# Patient Record
Sex: Male | Born: 1970
Health system: Southern US, Community
[De-identification: ages and names within clinical notes are randomized; demographics above are authoritative.]

## PROBLEM LIST (undated history)

## (undated) DIAGNOSIS — I2699 Other pulmonary embolism without acute cor pulmonale: Secondary | ICD-10-CM

## (undated) DIAGNOSIS — I82409 Acute embolism and thrombosis of unspecified deep veins of unspecified lower extremity: Secondary | ICD-10-CM

## (undated) HISTORY — PX: CARDIOVERSION: SHX1299

---

## 2017-01-16 DIAGNOSIS — B356 Tinea cruris: Secondary | ICD-10-CM | POA: Insufficient documentation

## 2017-01-16 DIAGNOSIS — I440 Atrioventricular block, first degree: Secondary | ICD-10-CM | POA: Insufficient documentation

## 2017-01-16 DIAGNOSIS — Z86711 Personal history of pulmonary embolism: Secondary | ICD-10-CM | POA: Insufficient documentation

## 2017-01-16 DIAGNOSIS — Z0000 Encounter for general adult medical examination without abnormal findings: Secondary | ICD-10-CM | POA: Diagnosis not present

## 2017-01-16 DIAGNOSIS — E782 Mixed hyperlipidemia: Secondary | ICD-10-CM | POA: Insufficient documentation

## 2017-10-18 DIAGNOSIS — M79604 Pain in right leg: Secondary | ICD-10-CM | POA: Insufficient documentation

## 2017-10-18 DIAGNOSIS — Z86718 Personal history of other venous thrombosis and embolism: Secondary | ICD-10-CM | POA: Diagnosis not present

## 2017-10-18 DIAGNOSIS — Z7901 Long term (current) use of anticoagulants: Secondary | ICD-10-CM | POA: Diagnosis not present

## 2017-10-18 DIAGNOSIS — M7989 Other specified soft tissue disorders: Secondary | ICD-10-CM | POA: Diagnosis not present

## 2017-10-19 ENCOUNTER — Other Ambulatory Visit: Payer: Self-pay

## 2017-10-19 ENCOUNTER — Emergency Department (HOSPITAL_BASED_OUTPATIENT_CLINIC_OR_DEPARTMENT_OTHER)
Admission: EM | Admit: 2017-10-19 | Discharge: 2017-10-19 | Disposition: A | Discharge status: Home / Self Care | Payer: 59 | Attending: Emergency Medicine | Admitting: Emergency Medicine

## 2017-10-19 ENCOUNTER — Encounter (HOSPITAL_BASED_OUTPATIENT_CLINIC_OR_DEPARTMENT_OTHER): Payer: Self-pay | Admitting: *Deleted

## 2017-10-19 ENCOUNTER — Ambulatory Visit (HOSPITAL_BASED_OUTPATIENT_CLINIC_OR_DEPARTMENT_OTHER)
Admit: 2017-10-19 | Discharge: 2017-10-19 | Disposition: A | Discharge status: Home / Self Care | Payer: 59 | Attending: Emergency Medicine | Admitting: Emergency Medicine

## 2017-10-19 DIAGNOSIS — M79604 Pain in right leg: Secondary | ICD-10-CM

## 2017-10-19 DIAGNOSIS — M7989 Other specified soft tissue disorders: Secondary | ICD-10-CM

## 2017-10-19 DIAGNOSIS — I82401 Acute embolism and thrombosis of unspecified deep veins of right lower extremity: Secondary | ICD-10-CM | POA: Diagnosis not present

## 2017-10-19 HISTORY — DX: Acute embolism and thrombosis of unspecified deep veins of unspecified lower extremity: I82.409

## 2017-10-19 HISTORY — DX: Other pulmonary embolism without acute cor pulmonale: I26.99

## 2017-10-19 LAB — BASIC METABOLIC PANEL
Anion gap: 8 (ref 5–15)
BUN: 17 mg/dL (ref 6–20)
CALCIUM: 8.7 mg/dL — AB (ref 8.9–10.3)
CHLORIDE: 104 mmol/L (ref 101–111)
CO2: 27 mmol/L (ref 22–32)
CREATININE: 1.05 mg/dL (ref 0.61–1.24)
GFR calc Af Amer: >60 mL/min (ref >60)
GFR calc non Af Amer: >60 mL/min (ref >60)
Glucose, Bld: 107 mg/dL — ABNORMAL HIGH (ref 65–99)
Potassium: 3.7 mmol/L (ref 3.5–5.1)
SODIUM: 139 mmol/L (ref 135–145)

## 2017-10-19 LAB — CBC WITH DIFFERENTIAL/PLATELET
Basophils Absolute: 0 10*3/uL (ref 0.0–0.1)
Basophils Relative: 1 %
Eosinophils Absolute: 0.2 10*3/uL (ref 0.0–0.7)
Eosinophils Relative: 4 %
HCT: 35.5 % — ABNORMAL LOW (ref 39.0–52.0)
HEMOGLOBIN: 11.8 g/dL — AB (ref 13.0–17.0)
LYMPHS ABS: 1.7 10*3/uL (ref 0.7–4.0)
Lymphocytes Relative: 35 %
MCH: 26.8 pg (ref 26.0–34.0)
MCHC: 33.2 g/dL (ref 30.0–36.0)
MCV: 80.7 fL (ref 78.0–100.0)
MONO ABS: 0.4 10*3/uL (ref 0.1–1.0)
MONOS PCT: 9 %
NEUTROS PCT: 51 %
Neutro Abs: 2.5 10*3/uL (ref 1.7–7.7)
Platelets: 146 10*3/uL — ABNORMAL LOW (ref 150–400)
RBC: 4.4 MIL/uL (ref 4.22–5.81)
RDW: 13.7 % (ref 11.5–15.5)
WBC: 4.8 10*3/uL (ref 4.0–10.5)

## 2017-10-19 LAB — D-DIMER, QUANTITATIVE: D-Dimer, Quant: 11.16 ug/mL-FEU — ABNORMAL HIGH (ref 0.00–0.50)

## 2017-10-19 MED ORDER — RIVAROXABAN (XARELTO) VTE STARTER PACK (15 & 20 MG): ORAL_TABLET | ORAL | 0 refills | Status: DC

## 2017-10-19 MED ORDER — RIVAROXABAN 15 MG PO TABS
15.0000 mg | ORAL_TABLET | Freq: Once | ORAL | Status: AC
  Administered 2017-10-19: 15 mg via ORAL
  Filled 2017-10-19: qty 1

## 2017-10-19 MED ORDER — ACETAMINOPHEN 500 MG PO TABS
1000.0000 mg | ORAL_TABLET | Freq: Once | ORAL | Status: AC
  Administered 2017-10-19: 1000 mg via ORAL
  Filled 2017-10-19: qty 2

## 2017-10-19 NOTE — ED Provider Notes (Signed)
4:16 PM [facility] today for results on lower extremity DVT ultrasound performed.  Patient's ultrasound was positive for deep venous thrombosis distal to the knee.  Patient was treated with initial dose of Xarelto during visit this morning and had appropriate labs drawn showing normal kidney function.  Patient has a prescription for this.  He was instructed to fill and begin taking immediately.  Patient symptoms are stable today.  He has not developed any chest pain or shortness of breath.  We discussed need to follow-up with his primary care physician.  Wife sees a doctor in this building and he will try to establish care with them.  He may also need to follow-up with a hematologist regarding difficulties with treating previous blood clot.  Counseled on signs and symptoms to return to the emergency department.   Renne CriglerGeiple, Ermagene Saidi, PA-C 10/19/17 1618    Rolland PorterJames, Mark, MD 10/24/17 [phone]

## 2017-10-19 NOTE — ED Triage Notes (Signed)
Right calf pain that started tonight. Hx of DVT. No redness, warmth noted. Denies increased pain with flexion of foot.

## 2017-10-19 NOTE — ED Provider Notes (Signed)
[facility] EMERGENCY DEPARTMENT Provider Note   CSN: [id number] Arrival date & time: 10/18/17  2335     History   Chief Complaint Chief Complaint  Patient presents with  . Leg Pain    HPI [facility] is a 47 y.o. male.  Chief complaint is right leg swelling.  HPI 47 year old male.  History of prior right lower extremity DVT.  This was several years ago.  He states that he underwent extensive testing for "blood disorders".  States that no etiology was found.  He states he was told his only risk factor was "obesity".  He is active.  He exercises.  He has lost 90 pounds.  Few days ago he noticed on the elliptical that his right leg was painful.  He assumed it was because he did not stretch.  However it seems swollen and more uncomfortable to him down he presents here.  No chest pain.  No shortness of breath.  No pleuritic discomfort.  Past Medical History:  Diagnosis Date  . DVT (deep venous thrombosis) (HCC)   . PE (pulmonary thromboembolism) (HCC)     There are no active problems to display for this patient.   Past Surgical History:  Procedure Laterality Date  . CARDIOVERSION         Home Medications    Prior to Admission medications   Medication Sig Start Date End Date Taking? Authorizing Provider  Rivaroxaban 15 & 20 MG TBPK Take as directed on package: Start with one 15mg  tablet by mouth twice a day with food. On Day 22, switch to one 20mg  tablet once a day with food. 10/19/17   Rolland PorterJames, Hollin Crewe, MD    Family History History reviewed. No pertinent family history.  Social History Social History   Tobacco Use  . Smoking status: Never Smoker  . Smokeless tobacco: Never Used  Substance Use Topics  . Alcohol use: No    Frequency: Never  . Drug use: No     Allergies   Patient has no known allergies.   Review of Systems Review of Systems  Constitutional: Negative for appetite change, chills, diaphoresis, fatigue and fever.  HENT: Negative for  mouth sores, sore throat and trouble swallowing.   Eyes: Negative for visual disturbance.  Respiratory: Negative for cough, chest tightness, shortness of breath and wheezing.   Cardiovascular: Negative for chest pain.  Gastrointestinal: Negative for abdominal distention, abdominal pain, diarrhea, nausea and vomiting.  Endocrine: Negative for polydipsia, polyphagia and polyuria.  Genitourinary: Negative for dysuria, frequency and hematuria.  Musculoskeletal: Negative for gait problem.       Right leg pain and swelling.  Skin: Negative for color change, pallor and rash.  Neurological: Negative for dizziness, syncope, light-headedness and headaches.  Hematological: Does not bruise/bleed easily.  Psychiatric/Behavioral: Negative for behavioral problems and confusion.     Physical Exam Updated Vital Signs BP 134/82 (BP Location: Right Arm)   Pulse (!) 59   Temp 98.8 F (37.1 C) (Oral)   Resp 18   Ht 6\' 7"  (2.007 m)   Wt 104.3 kg (230 lb)   SpO2 100%   BMI 25.91 kg/m   Physical Exam  Constitutional: He is oriented to person, place, and time. He appears well-developed and well-nourished. No distress.  HENT:  Head: Normocephalic.  Eyes: Conjunctivae are normal. Pupils are equal, round, and reactive to light. No scleral icterus.  Neck: Normal range of motion. Neck supple. No thyromegaly present.  Cardiovascular: Normal rate and regular rhythm. Exam reveals  no gallop and no friction rub.  No murmur heard. Pulmonary/Chest: Effort normal and breath sounds normal. No respiratory distress. He has no wheezes. He has no rales.  Abdominal: Soft. Bowel sounds are normal. He exhibits no distension. There is no tenderness. There is no rebound.  Musculoskeletal: Normal range of motion.  Right lower extremity measures 2 cm meters larger in circumference versus left.  I can feel areas of tenderness and cording along the area of the greater saphenous vein.  Neurological: He is alert and oriented to  person, place, and time.  Skin: Skin is warm and dry. No rash noted.  Psychiatric: He has a normal mood and affect. His behavior is normal.     ED Treatments / Results  Labs (all labs ordered are listed, but only abnormal results are displayed) Labs Reviewed  D-DIMER, QUANTITATIVE (NOT AT [facility]) - Abnormal; Notable for the following components:      Result Value   D-Dimer, Quant 11.16 (*)    All other components within normal limits  CBC WITH DIFFERENTIAL/PLATELET - Abnormal; Notable for the following components:   Hemoglobin 11.8 (*)    HCT 35.5 (*)    Platelets 146 (*)    All other components within normal limits  BASIC METABOLIC PANEL - Abnormal; Notable for the following components:   Glucose, Bld 107 (*)    Calcium 8.7 (*)    All other components within normal limits    EKG  EKG Interpretation None       Radiology No results found.  Procedures Procedures (including critical care time)  Medications Ordered in ED Medications  acetaminophen (TYLENOL) tablet 1,000 mg (not administered)  Rivaroxaban (XARELTO) tablet 15 mg (15 mg Oral Given 10/19/17 0428)     Initial Impression / Assessment and Plan / ED Course  I have reviewed the triage vital signs and the nursing notes.  Pertinent labs & imaging results that were available during my care of the patient were reviewed by me and considered in my medical decision making (see chart for details).   Dimer is markedly elevated at 11.  He was given Xarelto here.  Scheduled for ultrasound tomorrow.  Given prescription for Xarelto.  High probability this ultrasound will show DVT.  He has at least extensive SVT with his saphenous system.  Final Clinical Impressions(s) / ED Diagnoses   Final diagnoses:  Right leg pain    ED Discharge Orders        Ordered    US Venous Img Lower Unilateral Left  Status:  Canceled     10/19/17 0408    US Venous Img Lower Unilateral Right     10/19/17 0408    Rivaroxaban 15 & 20 MG  TBPK     10/19/17 0436       Rolland Porter, MD 10/19/17 [phone]

## 2017-10-19 NOTE — Discharge Instructions (Signed)
Return tomorrow for your ultrasound. After your ultrasound is completed, you can wait in the waiting room, and the emergency physician tomorrow will give you the results. Do not fill your prescription for your Xarelto until your ultrasound is completed.

## 2017-10-20 ENCOUNTER — Encounter (HOSPITAL_COMMUNITY): Payer: Self-pay | Admitting: Emergency Medicine

## 2017-10-20 ENCOUNTER — Other Ambulatory Visit: Payer: Self-pay

## 2017-10-20 ENCOUNTER — Emergency Department (HOSPITAL_COMMUNITY)
Admission: EM | Admit: 2017-10-20 | Discharge: 2017-10-21 | Disposition: A | Discharge status: Home / Self Care | Payer: 59 | Attending: Emergency Medicine | Admitting: Emergency Medicine

## 2017-10-20 DIAGNOSIS — R319 Hematuria, unspecified: Secondary | ICD-10-CM

## 2017-10-20 DIAGNOSIS — N200 Calculus of kidney: Secondary | ICD-10-CM | POA: Insufficient documentation

## 2017-10-20 DIAGNOSIS — N132 Hydronephrosis with renal and ureteral calculous obstruction: Secondary | ICD-10-CM | POA: Diagnosis not present

## 2017-10-20 DIAGNOSIS — Z7901 Long term (current) use of anticoagulants: Secondary | ICD-10-CM | POA: Diagnosis not present

## 2017-10-20 DIAGNOSIS — Z79899 Other long term (current) drug therapy: Secondary | ICD-10-CM | POA: Insufficient documentation

## 2017-10-20 LAB — BASIC METABOLIC PANEL
Anion gap: 10 (ref 5–15)
BUN: 12 mg/dL (ref 6–20)
CALCIUM: 8.6 mg/dL — AB (ref 8.9–10.3)
CO2: 25 mmol/L (ref 22–32)
CREATININE: 0.85 mg/dL (ref 0.61–1.24)
Chloride: 100 mmol/L — ABNORMAL LOW (ref 101–111)
GFR calc Af Amer: >60 mL/min (ref >60)
GLUCOSE: 103 mg/dL — AB (ref 65–99)
Potassium: 3.9 mmol/L (ref 3.5–5.1)
SODIUM: 135 mmol/L (ref 135–145)

## 2017-10-20 LAB — URINALYSIS, ROUTINE W REFLEX MICROSCOPIC
Bilirubin Urine: NEGATIVE
GLUCOSE, UA: NEGATIVE mg/dL
KETONES UR: NEGATIVE mg/dL
NITRITE: NEGATIVE
PROTEIN: 100 mg/dL — AB
Specific Gravity, Urine: 1.015 (ref 1.005–1.030)
Squamous Epithelial / LPF: NONE SEEN
pH: 7 (ref 5.0–8.0)

## 2017-10-20 LAB — CBC
HCT: 37.8 % — ABNORMAL LOW (ref 39.0–52.0)
Hemoglobin: 12.3 g/dL — ABNORMAL LOW (ref 13.0–17.0)
MCH: 26.5 pg (ref 26.0–34.0)
MCHC: 32.5 g/dL (ref 30.0–36.0)
MCV: 81.3 fL (ref 78.0–100.0)
PLATELETS: 169 10*3/uL (ref 150–400)
RBC: 4.65 MIL/uL (ref 4.22–5.81)
RDW: 13.9 % (ref 11.5–15.5)
WBC: 4.8 10*3/uL (ref 4.0–10.5)

## 2017-10-20 LAB — PROTIME-INR
INR: 1.18
PROTHROMBIN TIME: 14.9 s (ref 11.4–15.2)

## 2017-10-20 NOTE — ED Triage Notes (Signed)
Reports being diagnosed with DVT on RLE on Saturday.  Was placed on xarelto yesterday.  Noticed blood in urine last night.  Hx of DVT and PE in past.  Reports having to be on high doses of anticoags.  Also c/o low back pain.

## 2017-10-20 NOTE — ED Provider Notes (Signed)
[facility] Provider Note   CSN: [id number] Arrival date & time: 10/20/17  1929     History   Chief Complaint Chief Complaint  Patient presents with  . Hematuria    HPI [facility] is a 47 y.o. male.  47 year old male with a history of PE, diagnosed with DVT yesterday and started on Xarelto, presents to the emergency department for gross hematuria.  He states that hematuria began 4 hours after his first dose of this anticoagulant.  He has had waxing and waning, constant hematuria since onset of the symptoms.  He also notes a mild ache in his low back which is more midline.  Pain is constant and not positional.  It does not wax and wane in severity.  He has not taken any medications for his back discomfort.  He has no history of kidney stones.  No associated fever, nausea, vomiting, dysuria, bowel changes.  No recent trauma, injury, fall.  He was previously on warfarin many years ago after diagnosis of pulmonary embolus.  He was taken off of this medication following electrical cardioversion as his warfarin was thought to have incited onset of atrial fibrillation.      Past Medical History:  Diagnosis Date  . DVT (deep venous thrombosis) (HCC)   . PE (pulmonary thromboembolism) (HCC)     There are no active problems to display for this patient.   Past Surgical History:  Procedure Laterality Date  . CARDIOVERSION         Home Medications    Prior to Admission medications   Medication Sig Start Date End Date Taking? Authorizing Provider  acetaminophen (TYLENOL) 500 MG tablet Take 1,000 mg by mouth every 6 (six) hours as needed for mild pain.   Yes [provider]  Rivaroxaban 15 & 20 MG TBPK Take as directed on package: Start with one 15mg  tablet by mouth twice a day with food. On Day 22, switch to one 20mg  tablet once a day with food. 10/19/17  Yes Rolland Porter, MD    Family History No family history on  file.  Social History Social History   Tobacco Use  . Smoking status: Never Smoker  . Smokeless tobacco: Never Used  Substance Use Topics  . Alcohol use: No    Frequency: Never  . Drug use: No     Allergies   Patient has no known allergies.   Review of Systems Review of Systems Ten systems reviewed and are negative for acute change, except as noted in the HPI.    Physical Exam Updated Vital Signs BP 122/71   Pulse (!) 51   Temp 97.8 F (36.6 C) (Oral)   Resp 15   Ht 6\' 7"  (2.007 m)   Wt 104.3 kg (230 lb)   SpO2 98%   BMI 25.91 kg/m   Physical Exam  Constitutional: He is oriented to person, place, and time. He appears well-developed and well-nourished. No distress.  Nontoxic appearing and pleasant  HENT:  Head: Normocephalic and atraumatic.  Eyes: Conjunctivae and EOM are normal. No scleral icterus.  Neck: Normal range of motion.  Cardiovascular: Normal rate, regular rhythm and intact distal pulses.  Pulmonary/Chest: Effort normal. No stridor. No respiratory distress. He has no wheezes.  Respirations even and unlabored.  Abdominal: Soft. He exhibits no distension and no mass. There is no tenderness. There is no guarding.  Soft, nondistended, nontender. No guarding or rigidity.  Musculoskeletal: Normal range of motion.  Neurological: He is  alert and oriented to person, place, and time. He exhibits normal muscle tone. Coordination normal.  GCS 15. Patient moving all extremities.  Skin: Skin is warm and dry. No rash noted. He is not diaphoretic. No erythema. No pallor.  No bruising to back or abdomen.  Psychiatric: He has a normal mood and affect. His behavior is normal.  Nursing note and vitals reviewed.    ED Treatments / Results  Labs (all labs ordered are listed, but only abnormal results are displayed) Labs Reviewed  URINALYSIS, ROUTINE W REFLEX MICROSCOPIC - Abnormal; Notable for the following components:      Result Value   APPearance CLOUDY (*)     Hgb urine dipstick LARGE (*)    Protein, ur 100 (*)    Leukocytes, UA TRACE (*)    Bacteria, UA RARE (*)    All other components within normal limits  BASIC METABOLIC PANEL - Abnormal; Notable for the following components:   Chloride 100 (*)    Glucose, Bld 103 (*)    Calcium 8.6 (*)    All other components within normal limits  CBC - Abnormal; Notable for the following components:   Hemoglobin 12.3 (*)    HCT 37.8 (*)    All other components within normal limits  PROTIME-INR    EKG  EKG Interpretation None       Radiology [place] Venous Img Lower Unilateral Right  Result Date: 10/19/2017 CLINICAL DATA:  Right calf pain and swelling for 3 days. EXAM: RIGHT LOWER EXTREMITY VENOUS DOPPLER ULTRASOUND TECHNIQUE: Gray-scale sonography with graded compression, as well as color Doppler and duplex ultrasound were performed to evaluate the lower extremity deep venous systems from the level of the common femoral vein and including the common femoral, femoral, profunda femoral, popliteal and calf veins including the posterior tibial, peroneal and gastrocnemius veins when visible. The superficial great saphenous vein was also interrogated. Spectral Doppler was utilized to evaluate flow at rest and with distal augmentation maneuvers in the common femoral, femoral and popliteal veins. COMPARISON:  None. FINDINGS: Contralateral Common Femoral Vein: Respiratory phasicity is normal and symmetric with the symptomatic side. No evidence of thrombus. Normal compressibility. Common Femoral Vein: No evidence of thrombus. Normal compressibility, respiratory phasicity and response to augmentation. Saphenofemoral Junction: No evidence of thrombus. Normal compressibility and flow on color Doppler imaging. Profunda Femoral Vein: No evidence of thrombus. Normal compressibility and flow on color Doppler imaging. Femoral Vein: No evidence of thrombus. Normal compressibility, respiratory phasicity and response to  augmentation. Popliteal Vein: Partial compressibility of the right popliteal vein with echogenic thrombus. Popliteal vein thrombus is nonocclusive and associated with the gastrocnemius thrombus. Calf Veins: Gastrocnemius vein(s) is markedly expanded and contains a large amount of thrombus. This gastrocnemius thrombus is extending into the popliteal vein. In addition, there is thrombus involving the mid and distal posterior tibial veins. Posterior tibial veins at the ankle are patent. Peroneal veins are compressible and patent. Superficial Great Saphenous Vein: There is thrombus in the great saphenous vein within the superior calf and at the knee. The upper thigh great saphenous vein is compressible and patent. Other Findings:  None. IMPRESSION: Positive for deep venous thrombosis in the right lower extremity. Largest clot burden involves the right gastrocnemius veins. There is also thrombus in the right posterior tibial veins and there is thrombus extending into the right popliteal vein. Positive for superficial venous thrombosis in the right great saphenous vein. Electronically Signed   By: Richarda OverlieAdam  Henn M.D.   On:  10/19/2017 15:38   Ct Renal Stone Study  Result Date: 10/21/2017 CLINICAL DATA:  Hematuria, unknown cause. Recent diagnosis of DVT started anticoagulation. EXAM: CT ABDOMEN AND PELVIS WITHOUT CONTRAST TECHNIQUE: Multidetector CT imaging of the abdomen and pelvis was performed following the standard protocol without IV contrast. COMPARISON:  None. FINDINGS: Lower chest: Mild hypoventilatory changes at the lung bases. Hepatobiliary: No focal hepatic lesion allowing for lack contrast. Gallbladder is decompressed. No calcified gallstone. Pancreas: No ductal dilatation or inflammation. Spleen: Normal in size without focal abnormality. Adrenals/Urinary Tract: No adrenal nodule. Horseshoe kidney configuration. Mild hydronephrosis of left renal moiety with 2 adjacent stones conglomerate measuring 13 mm in the  renal pelvis. Mild prominence of the right renal pelvis without calyceal dilatation. Punctate stone in the lower right renal moiety. Both ureters are decompressed without ureteral stone. Urinary bladder is partially distended without bladder wall thickening or stone. Stomach/Bowel: Stomach distended with ingested contents. No small bowel dilatation, inflammation or obstruction. Moderate colonic stool burden. Mild distal colonic diverticulosis without diverticulitis. Appendix not confidently visualized. Vascular/Lymphatic: Mild tortuosity of infrarenal aorta and IVC. No enlarged abdominal or pelvic lymph nodes. Reproductive: Prostate is unremarkable. Prominent right seminal vesicle. No definite left seminal vesicle visualized. Other: No free air, free fluid, or intra-abdominal fluid collection. Musculoskeletal: There are no acute or suspicious osseous abnormalities. IMPRESSION: 1. Horseshoe kidney with mild hydronephrosis and perinephric edema about the left renal moiety. Two adjacent stones in the left renal pelvis measures 13 mm are likely intermittently obstructing. No ureteral stone. 2. Nonobstructing stone in the lower right renal moiety. 3. Mild colonic diverticulosis without diverticulitis. Electronically Signed   By: Rubye Oaks M.D.   On: 10/21/2017 00:50    Procedures Procedures (including critical care time)  Medications Ordered in ED Medications - No data to display    2:00 AM On repeat assessment, patient states that his back pain has improved and his hematuria has resolved.  He had a void in the emergency department which was free of blood.  Last dose of Xarelto was 16 hours ago.   Initial Impression / Assessment and Plan / ED Course  I have reviewed the triage vital signs and the nursing notes.  Pertinent labs & imaging results that were available during my care of the patient were reviewed by me and considered in my medical decision making (see chart for details).      47 year old male presents to the emergency department for hematuria.  He was started on Xarelto yesterday after an ultrasound revealed DVT in the right lower extremity.  Hematuria waxing and waning in severity.  It is associated with lower back discomfort which has been a mild ache.  No associated nausea, vomiting, bowel changes, fever.  Patient noted to be hemodynamically stable since ED arrival.  He has had no tachycardia or hypotension.  Hemoglobin similar to blood draw yesterday.  Urinalysis did reveal gross hematuria without bacteriuria or pyuria.  A CT was recommended and performed.  CT scan today reveals mild hydronephrosis and perinephric edema on the left.  This is associated with a 13 mm stone in the left renal pelvis.  Findings favored to represent intermittent obstructing kidney stone.  No other acute process noted in the abdomen or pelvis.  No evidence of free fluid or other intra-abdominal fluid collection.  Patient has no bruising to his back or anterior abdomen.  I do not believe his pain is secondary to retroperitoneal hemorrhage or other spontaneous intra-abdominal bleed.  On repeat assessment, patient  reports resolution of his hematuria and back pain.  I do not see any indication to change his current anticoagulation regimen.  The patient has been given strict bleeding precautions.  I have advised continued follow-up with a primary care doctor for recheck of symptoms and repeat hemoglobin.  Patient told to avoid NSAIDs.  He may take Tylenol as needed for pain; patient verbalizes understanding.  Return precautions provided. Patient discharged in stable condition with no unaddressed concerns.  Vitals:   10/20/17 2300 10/20/17 2315 10/20/17 2330 10/20/17 2345  BP: 124/89 (!) 141/86 (!) 141/93 122/71  Pulse: (!) 50 (!) 49 (!) 55 (!) 51  Resp:      Temp:      TempSrc:      SpO2: 99% 100% 98% 98%  Weight:      Height:        Final Clinical Impressions(s) / ED Diagnoses   Final  diagnoses:  Calculus of renal pelvis  Hematuria, unspecified type    ED Discharge Orders    None       Antony Madura, PA-C 10/21/17 0326    Shon Baton, MD 10/21/17 [phone]

## 2017-10-21 ENCOUNTER — Emergency Department (HOSPITAL_COMMUNITY): Payer: 59

## 2017-10-21 DIAGNOSIS — N132 Hydronephrosis with renal and ureteral calculous obstruction: Secondary | ICD-10-CM | POA: Diagnosis not present

## 2017-10-21 NOTE — ED Notes (Signed)
Pt to CT at this time.  Wife at bedside.

## 2017-10-21 NOTE — Discharge Instructions (Signed)
Your CT scan today showed a stone in your left renal pelvis.  There is evidence that this stone may be intermittently obstructing your left ureter.  This can account for your hematuria as well as you are back pain.  Your kidney function is stable.  Your hemoglobin is reassuring and also stable compared to your workup yesterday.  There is no evidence of a concerning degree of blood loss despite you taking daily Xarelto.  We advise follow-up with a primary care doctor for a recheck of your symptoms.  Continue taking Xarelto as previously prescribed.  Should you develop lightheadedness, especially with position change, loss of consciousness, chest pain or shortness of breath, palpitations (especially in the setting of persistent bleeding), we advise prompt return to the emergency department for evaluation.  You may also return for other new or concerning symptoms.

## 2017-10-25 DIAGNOSIS — D500 Iron deficiency anemia secondary to blood loss (chronic): Secondary | ICD-10-CM | POA: Diagnosis not present

## 2017-10-25 DIAGNOSIS — I82401 Acute embolism and thrombosis of unspecified deep veins of right lower extremity: Secondary | ICD-10-CM | POA: Diagnosis not present

## 2017-10-25 DIAGNOSIS — N200 Calculus of kidney: Secondary | ICD-10-CM | POA: Insufficient documentation

## 2017-10-25 DIAGNOSIS — R310 Gross hematuria: Secondary | ICD-10-CM | POA: Insufficient documentation

## 2017-10-25 DIAGNOSIS — Q631 Lobulated, fused and horseshoe kidney: Secondary | ICD-10-CM | POA: Diagnosis not present

## 2017-10-28 DIAGNOSIS — N200 Calculus of kidney: Secondary | ICD-10-CM | POA: Diagnosis not present

## 2017-11-02 DIAGNOSIS — Z9229 Personal history of other drug therapy: Secondary | ICD-10-CM | POA: Diagnosis not present

## 2017-11-05 DIAGNOSIS — Q631 Lobulated, fused and horseshoe kidney: Secondary | ICD-10-CM | POA: Diagnosis not present

## 2017-11-05 DIAGNOSIS — N200 Calculus of kidney: Secondary | ICD-10-CM | POA: Diagnosis not present

## 2017-11-05 DIAGNOSIS — R310 Gross hematuria: Secondary | ICD-10-CM | POA: Diagnosis not present

## 2017-11-06 DIAGNOSIS — Z86711 Personal history of pulmonary embolism: Secondary | ICD-10-CM | POA: Diagnosis not present

## 2017-11-06 DIAGNOSIS — Q631 Lobulated, fused and horseshoe kidney: Secondary | ICD-10-CM | POA: Diagnosis not present

## 2017-11-06 DIAGNOSIS — I82401 Acute embolism and thrombosis of unspecified deep veins of right lower extremity: Secondary | ICD-10-CM | POA: Diagnosis not present

## 2017-11-06 DIAGNOSIS — Z7189 Other specified counseling: Secondary | ICD-10-CM | POA: Insufficient documentation

## 2017-12-06 DIAGNOSIS — I824Z9 Acute embolism and thrombosis of unspecified deep veins of unspecified distal lower extremity: Secondary | ICD-10-CM | POA: Diagnosis not present

## 2017-12-06 DIAGNOSIS — Z7901 Long term (current) use of anticoagulants: Secondary | ICD-10-CM | POA: Diagnosis not present

## 2017-12-06 DIAGNOSIS — Q631 Lobulated, fused and horseshoe kidney: Secondary | ICD-10-CM | POA: Diagnosis not present

## 2017-12-06 DIAGNOSIS — R310 Gross hematuria: Secondary | ICD-10-CM | POA: Diagnosis not present

## 2017-12-09 DIAGNOSIS — I82409 Acute embolism and thrombosis of unspecified deep veins of unspecified lower extremity: Secondary | ICD-10-CM | POA: Diagnosis not present

## 2017-12-24 DIAGNOSIS — N200 Calculus of kidney: Secondary | ICD-10-CM | POA: Diagnosis not present

## 2017-12-24 DIAGNOSIS — Z86711 Personal history of pulmonary embolism: Secondary | ICD-10-CM | POA: Diagnosis not present

## 2017-12-24 DIAGNOSIS — I82401 Acute embolism and thrombosis of unspecified deep veins of right lower extremity: Secondary | ICD-10-CM | POA: Diagnosis not present

## 2018-01-09 DIAGNOSIS — N200 Calculus of kidney: Secondary | ICD-10-CM | POA: Diagnosis not present

## 2018-01-09 DIAGNOSIS — Q631 Lobulated, fused and horseshoe kidney: Secondary | ICD-10-CM | POA: Diagnosis not present

## 2018-01-10 DIAGNOSIS — N200 Calculus of kidney: Secondary | ICD-10-CM | POA: Diagnosis not present

## 2018-01-10 DIAGNOSIS — Q631 Lobulated, fused and horseshoe kidney: Secondary | ICD-10-CM | POA: Diagnosis not present

## 2018-01-10 DIAGNOSIS — R310 Gross hematuria: Secondary | ICD-10-CM | POA: Diagnosis not present

## 2018-01-14 DIAGNOSIS — E782 Mixed hyperlipidemia: Secondary | ICD-10-CM | POA: Diagnosis not present

## 2018-01-14 DIAGNOSIS — I480 Paroxysmal atrial fibrillation: Secondary | ICD-10-CM | POA: Insufficient documentation

## 2018-01-14 DIAGNOSIS — D509 Iron deficiency anemia, unspecified: Secondary | ICD-10-CM | POA: Insufficient documentation

## 2018-01-14 DIAGNOSIS — N200 Calculus of kidney: Secondary | ICD-10-CM | POA: Diagnosis not present

## 2018-01-14 DIAGNOSIS — Q631 Lobulated, fused and horseshoe kidney: Secondary | ICD-10-CM | POA: Diagnosis not present

## 2018-01-17 DIAGNOSIS — Z0000 Encounter for general adult medical examination without abnormal findings: Secondary | ICD-10-CM | POA: Diagnosis not present

## 2018-02-05 DIAGNOSIS — N200 Calculus of kidney: Secondary | ICD-10-CM | POA: Diagnosis not present

## 2018-02-19 DIAGNOSIS — N200 Calculus of kidney: Secondary | ICD-10-CM | POA: Diagnosis not present

## 2018-03-12 DIAGNOSIS — N200 Calculus of kidney: Secondary | ICD-10-CM | POA: Diagnosis not present

## 2018-03-26 DIAGNOSIS — N200 Calculus of kidney: Secondary | ICD-10-CM | POA: Diagnosis not present

## 2018-03-27 DIAGNOSIS — N200 Calculus of kidney: Secondary | ICD-10-CM | POA: Diagnosis not present

## 2018-06-02 DIAGNOSIS — N200 Calculus of kidney: Secondary | ICD-10-CM | POA: Diagnosis not present

## 2018-06-02 DIAGNOSIS — Q631 Lobulated, fused and horseshoe kidney: Secondary | ICD-10-CM | POA: Diagnosis not present

## 2018-06-11 DIAGNOSIS — N200 Calculus of kidney: Secondary | ICD-10-CM | POA: Diagnosis not present

## 2018-06-16 IMAGING — CT CT RENAL STONE PROTOCOL
2 of 4 series · 16 of 46 positions shown, 18 images · non-contrast
Comparison: None.

CLINICAL DATA: Hematuria, unknown cause. Recent diagnosis of DVT
started anticoagulation.

EXAM:
CT ABDOMEN AND PELVIS WITHOUT CONTRAST
TECHNIQUE: Multidetector CT imaging of the abdomen and pelvis was performed
following the standard protocol without IV contrast.

[Series 3: renal stone 5.0 · axial · 0.85mm/px · z∈[-590,-110]mm · 13 of 106 slices shown, 15 images]
[im 5/106  soft-tissue]
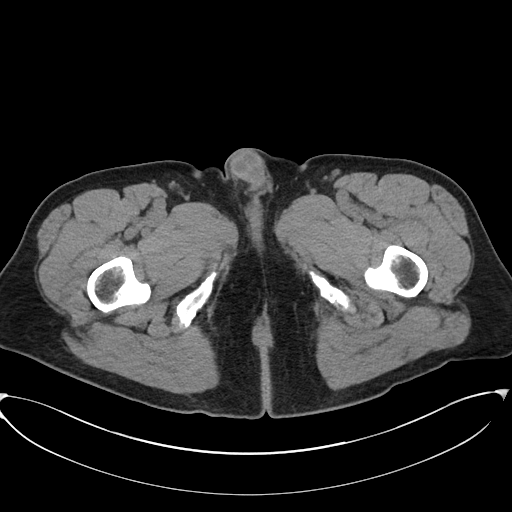
[im 5/106  bone]
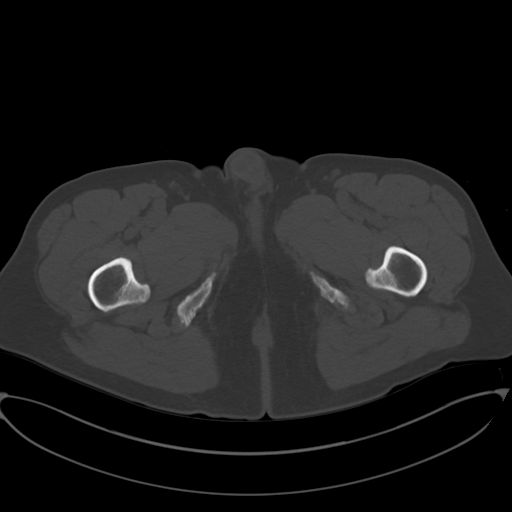
[im 13/106  soft-tissue]
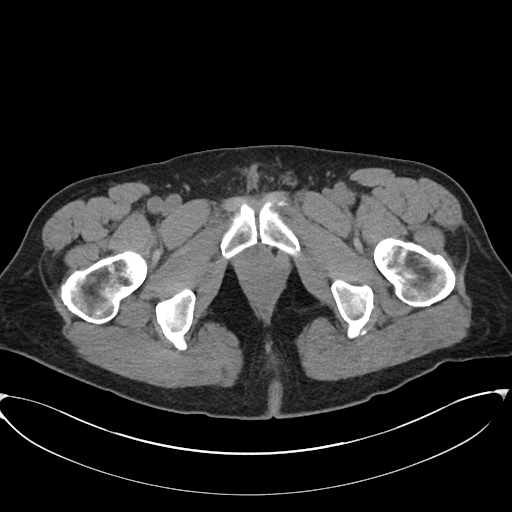
[im 21/106  soft-tissue]
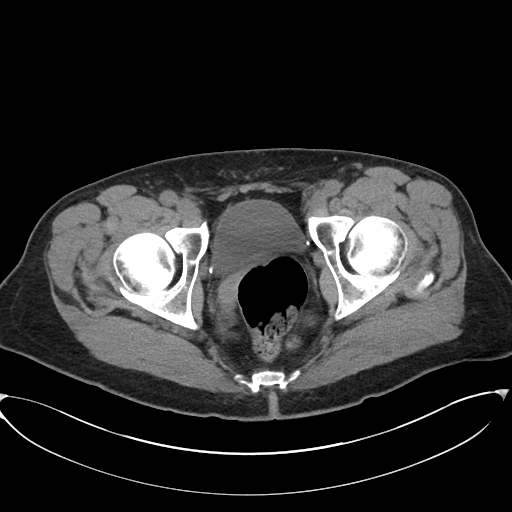
[im 29/106  soft-tissue]
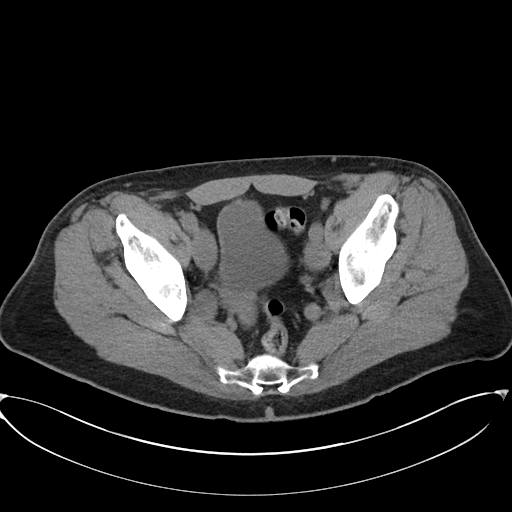
[im 37/106  soft-tissue]
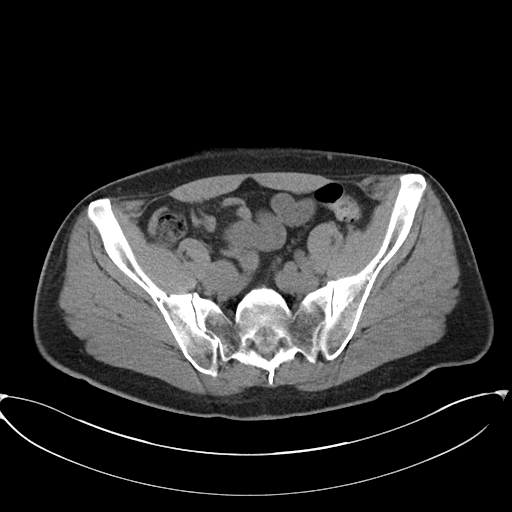
[im 45/106  soft-tissue]
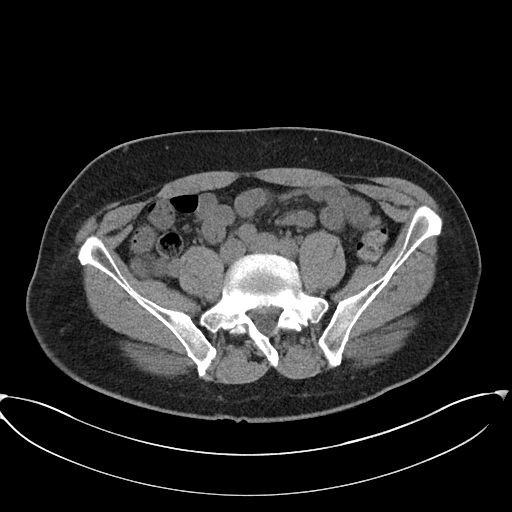
[im 53/106  soft-tissue]
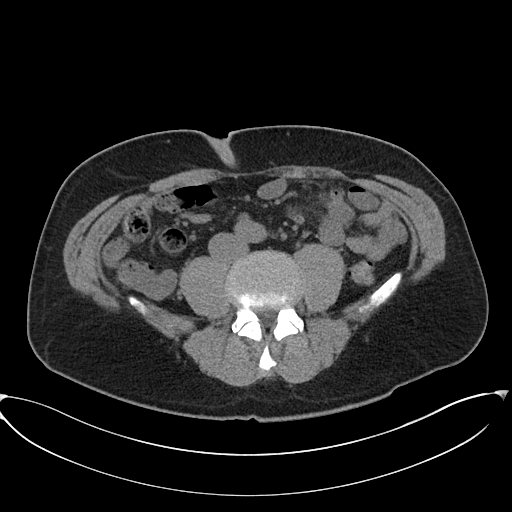
[im 61/106  soft-tissue]
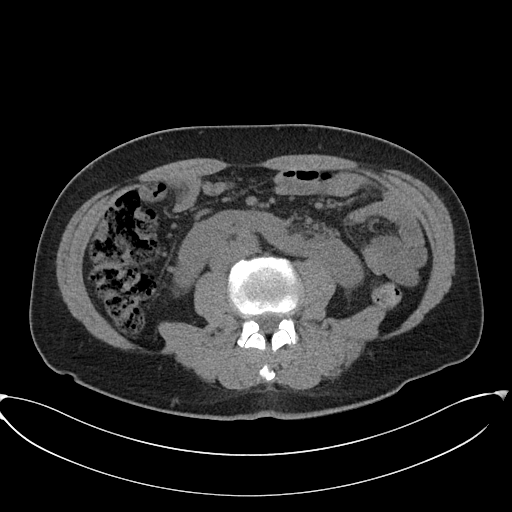
[im 69/106  soft-tissue]
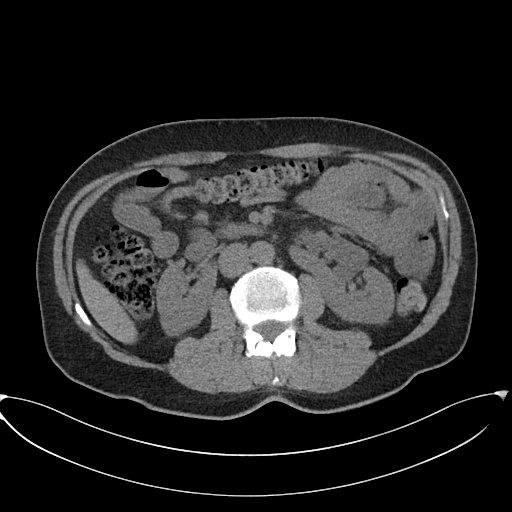
[im 69/106  bone]
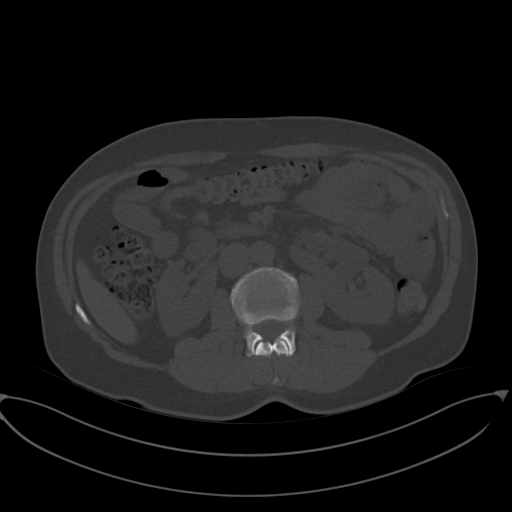
[im 77/106  soft-tissue]
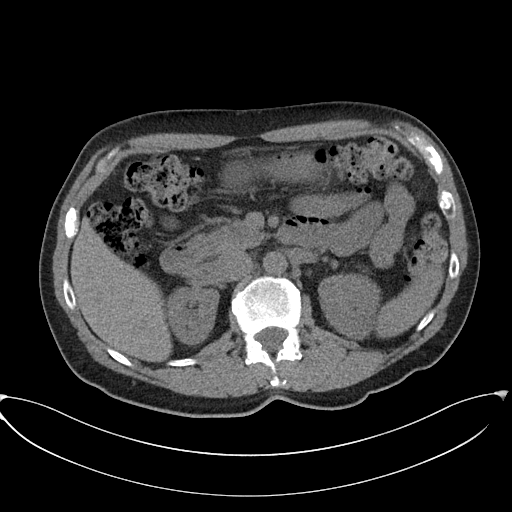
[im 85/106  soft-tissue]
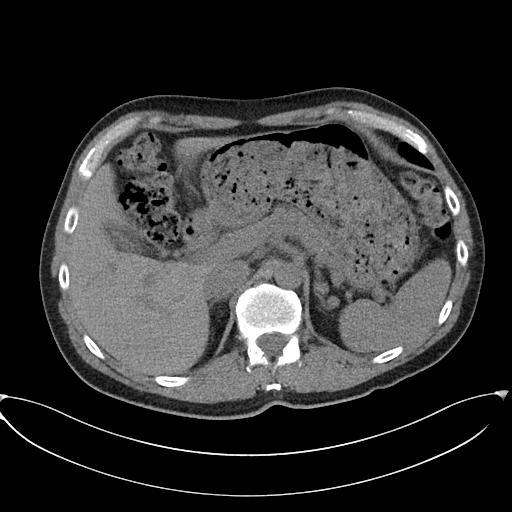
[im 93/106  soft-tissue]
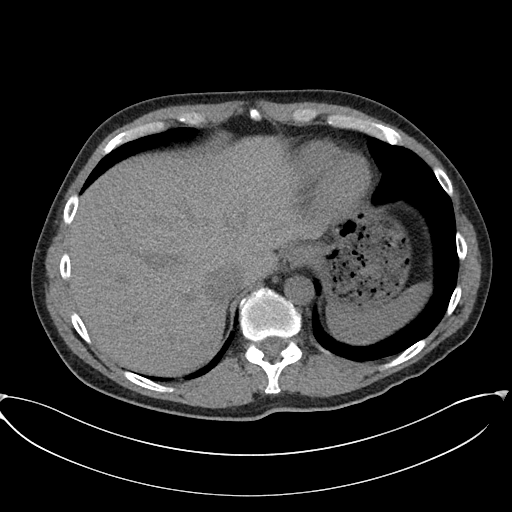
[im 101/106  soft-tissue]
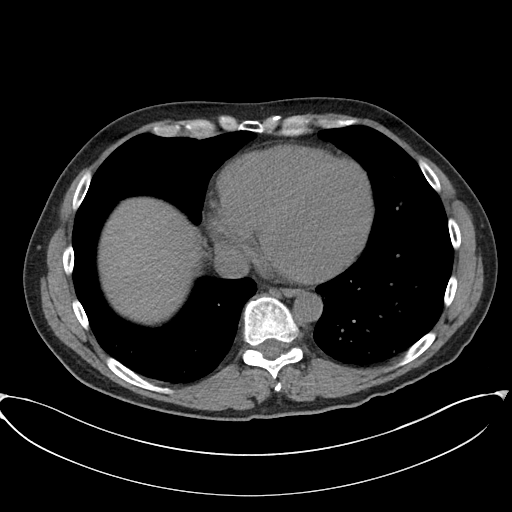

[Series 5: renal stone 3.0 cor · coronal · 1.01mm/px · 3 of 88 slices shown]
[im 30/88  soft-tissue]
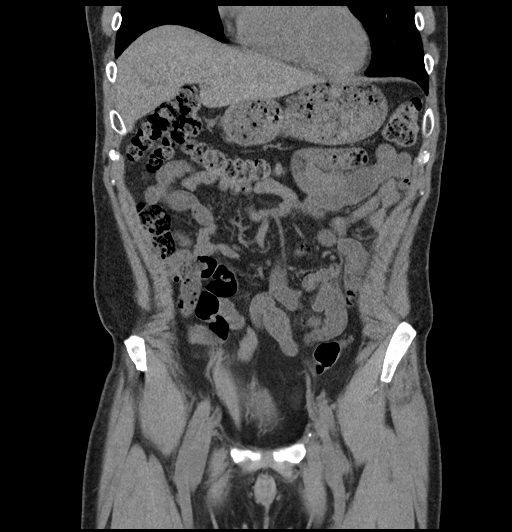
[im 39/88  soft-tissue]
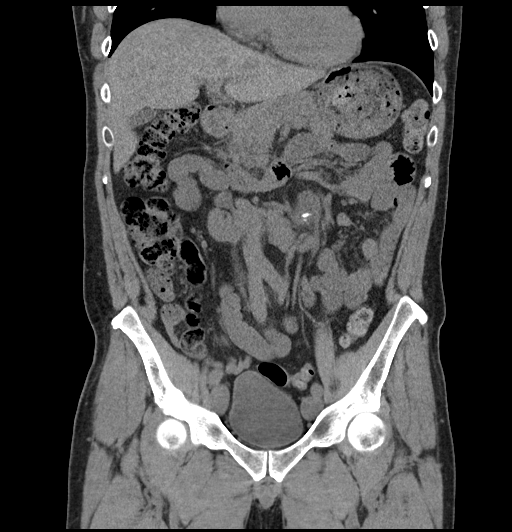
[im 49/88  soft-tissue]
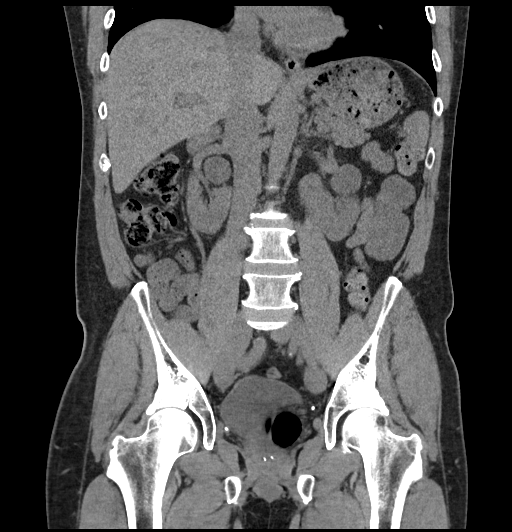

[16 of 46 positions shown; findings below may reference images not displayed]

FINDINGS: Lower chest: Mild hypoventilatory changes at the lung bases.

Hepatobiliary: No focal hepatic lesion allowing for lack contrast.
Gallbladder is decompressed. No calcified gallstone.

Pancreas: No ductal dilatation or inflammation.

Spleen: Normal in size without focal abnormality.

Adrenals/Urinary Tract: No adrenal nodule. Horseshoe kidney
configuration. Mild hydronephrosis of left renal moiety with 2
adjacent stones conglomerate measuring 13 mm in the renal pelvis.
Mild prominence of the right renal pelvis without calyceal
dilatation. Punctate stone in the lower right renal moiety. Both
ureters are decompressed without ureteral stone. Urinary bladder is
partially distended without bladder wall thickening or stone.

Stomach/Bowel: Stomach distended with ingested contents. No small
bowel dilatation, inflammation or obstruction. Moderate colonic
stool burden. Mild distal colonic diverticulosis without
diverticulitis. Appendix not confidently visualized.

Vascular/Lymphatic: Mild tortuosity of infrarenal aorta and IVC. No
enlarged abdominal or pelvic lymph nodes.

Reproductive: Prostate is unremarkable. Prominent right seminal
vesicle. No definite left seminal vesicle visualized.

Other: No free air, free fluid, or intra-abdominal fluid collection.

Musculoskeletal: There are no acute or suspicious osseous
abnormalities.
IMPRESSION: 1. Horseshoe kidney with mild hydronephrosis and perinephric edema
about the left renal moiety. Two adjacent stones in the left renal
pelvis measures 13 mm are likely intermittently obstructing. No
ureteral stone.
2. Nonobstructing stone in the lower right renal moiety.
3. Mild colonic diverticulosis without diverticulitis.

## 2018-07-29 DIAGNOSIS — N200 Calculus of kidney: Secondary | ICD-10-CM | POA: Diagnosis not present

## 2018-07-29 DIAGNOSIS — Z23 Encounter for immunization: Secondary | ICD-10-CM | POA: Diagnosis not present

## 2018-07-29 DIAGNOSIS — Z0000 Encounter for general adult medical examination without abnormal findings: Secondary | ICD-10-CM | POA: Diagnosis not present

## 2018-07-29 DIAGNOSIS — Z0184 Encounter for antibody response examination: Secondary | ICD-10-CM | POA: Diagnosis not present

## 2018-07-29 DIAGNOSIS — Z13220 Encounter for screening for lipoid disorders: Secondary | ICD-10-CM | POA: Diagnosis not present

## 2018-08-06 DIAGNOSIS — Q631 Lobulated, fused and horseshoe kidney: Secondary | ICD-10-CM | POA: Diagnosis not present

## 2018-08-06 DIAGNOSIS — N200 Calculus of kidney: Secondary | ICD-10-CM | POA: Diagnosis not present

## 2018-08-18 DIAGNOSIS — N200 Calculus of kidney: Secondary | ICD-10-CM | POA: Diagnosis not present

## 2019-06-13 DIAGNOSIS — S92425A Nondisplaced fracture of distal phalanx of left great toe, initial encounter for closed fracture: Secondary | ICD-10-CM | POA: Diagnosis not present

## 2019-06-23 DIAGNOSIS — S92422A Displaced fracture of distal phalanx of left great toe, initial encounter for closed fracture: Secondary | ICD-10-CM | POA: Diagnosis not present

## 2019-06-23 DIAGNOSIS — M25572 Pain in left ankle and joints of left foot: Secondary | ICD-10-CM | POA: Diagnosis not present

## 2019-06-23 DIAGNOSIS — R52 Pain, unspecified: Secondary | ICD-10-CM | POA: Diagnosis not present

## 2020-03-22 DIAGNOSIS — Z0000 Encounter for general adult medical examination without abnormal findings: Secondary | ICD-10-CM | POA: Diagnosis not present

## 2020-03-22 DIAGNOSIS — E7800 Pure hypercholesterolemia, unspecified: Secondary | ICD-10-CM | POA: Diagnosis not present

## 2020-04-01 DIAGNOSIS — Z1211 Encounter for screening for malignant neoplasm of colon: Secondary | ICD-10-CM | POA: Diagnosis not present

## 2020-04-19 ENCOUNTER — Ambulatory Visit: Payer: BC Managed Care – PPO | Admitting: Physician Assistant

## 2020-04-19 ENCOUNTER — Other Ambulatory Visit: Payer: Self-pay | Admitting: *Deleted

## 2020-04-19 ENCOUNTER — Other Ambulatory Visit: Payer: Self-pay

## 2020-04-19 ENCOUNTER — Ambulatory Visit (HOSPITAL_COMMUNITY)
Admission: RE | Admit: 2020-04-19 | Discharge: 2020-04-19 | Disposition: A | Discharge status: Home / Self Care | Payer: BC Managed Care – PPO | Source: Ambulatory Visit | Attending: Vascular Surgery | Admitting: Vascular Surgery

## 2020-04-19 DIAGNOSIS — M25561 Pain in right knee: Secondary | ICD-10-CM

## 2020-04-19 DIAGNOSIS — I83893 Varicose veins of bilateral lower extremities with other complications: Secondary | ICD-10-CM | POA: Diagnosis not present

## 2020-04-19 DIAGNOSIS — M25562 Pain in left knee: Secondary | ICD-10-CM

## 2020-04-19 DIAGNOSIS — I872 Venous insufficiency (chronic) (peripheral): Secondary | ICD-10-CM

## 2020-04-19 DIAGNOSIS — M7989 Other specified soft tissue disorders: Secondary | ICD-10-CM

## 2020-04-19 NOTE — Progress Notes (Signed)
Requested by:  Dan Maker., MD [address] [place],  [place] [address]  Reason for consultation: varicose veins    History of Present Illness   Dennis Hughes is a 49 y.o. (02/26/1971) male who presents for evaluation of varicose veins of bilateral lower extremities  Venous symptoms include: aching, heavy, tired feeling of BLE; edema to the level of the mid shin if standing for any period of time Onset/duration:  Varicose veins have been becoming more prominent over the past few months  Aggravating factors: sitting, standing for prolonged periods of time Alleviating factors: elevating legs at night Compression:  none Pain medications:  none Previous vein procedures:  none History of DVT:  History of RLE DVT and PE in 2010 and another DVT of RLE in 2015.  Not on anticoagulation after extensive workup with hematology  Past Medical History:  Diagnosis Date  . DVT (deep venous thrombosis) (HCC)   . PE (pulmonary thromboembolism) (HCC)     Past Surgical History:  Procedure Laterality Date  . CARDIOVERSION      Social History   Socioeconomic History  . Marital status: Married    Spouse name: Not on file  . Number of children: Not on file  . Years of education: Not on file  . Highest education level: Not on file  Occupational History  . Not on file  Tobacco Use  . Smoking status: Never Smoker  . Smokeless tobacco: Never Used  Substance and Sexual Activity  . Alcohol use: No  . Drug use: No  . Sexual activity: Not on file  Other Topics Concern  . Not on file  Social History Narrative  . Not on file   Social Determinants of Health   Financial Resource Strain:   . Difficulty of Paying Living Expenses:   Food Insecurity:   . Worried About Programme researcher, broadcasting/film/video in the Last Year:   . Barista in the Last Year:   Transportation Needs:   . Freight forwarder (Medical):   Marland Kitchen Lack of Transportation (Non-Medical):   Physical Activity:    . Days of Exercise per Week:   . Minutes of Exercise per Session:   Stress:   . Feeling of Stress :   Social Connections:   . Frequency of Communication with Friends and Family:   . Frequency of Social Gatherings with Friends and Family:   . Attends Religious Services:   . Active Member of Clubs or Organizations:   . Attends Banker Meetings:   Marland Kitchen Marital Status:   Intimate Partner Violence:   . Fear of Current or Ex-Partner:   . Emotionally Abused:   Marland Kitchen Physically Abused:   . Sexually Abused:     Family History  Problem Relation Age of Onset  . Cancer Mother   . Diabetes Mother   . Hypertension Mother   . Cancer Father   . Hypertension Father   . Diabetes Father   . Heart disease Father   . Cancer Maternal Grandmother   . Cancer Maternal Grandfather   . Cancer Paternal Grandmother   . Cancer Paternal Grandfather     No current outpatient medications on file.   No current facility-administered medications for this visit.    No Known Allergies  REVIEW OF SYSTEMS (negative unless checked):   Cardiac:  []  Chest pain or chest pressure? []  Shortness of breath upon activity? []  Shortness of breath when lying flat? []  Irregular heart  rhythm?  Vascular:  []  Pain in calf, thigh, or hip brought on by walking? []  Pain in feet at night that wakes you up from your sleep? []  Blood clot in your veins? []  Leg swelling?  Pulmonary:  []  Oxygen at home? []  Productive cough? []  Wheezing?  Neurologic:  []  Sudden weakness in arms or legs? []  Sudden numbness in arms or legs? []  Sudden onset of difficult speaking or slurred speech? []  Temporary loss of vision in one eye? []  Problems with dizziness?  Gastrointestinal:  []  Blood in stool? []  Vomited blood?  Genitourinary:  []  Burning when urinating? []  Blood in urine?  Psychiatric:  []  Major depression  Hematologic:  []  Bleeding problems? []  Problems with blood clotting?  Dermatologic:  []  Rashes  or ulcers?  Constitutional:  []  Fever or chills?  Ear/Nose/Throat:  []  Change in hearing? []  Nose bleeds? []  Sore throat?  Musculoskeletal:  []  Back pain? []  Joint pain? []  Muscle pain?   Physical Examination     Vitals:   04/19/20 1152  BP: 118/81  Pulse: (!) 51  Resp: 20  Temp: 98.4 F (36.9 C)  TempSrc: Temporal  SpO2: 99%  Weight: 238 lb 1.6 oz (108 kg)  Height: 6\' 7"  (2.007 m)   Body mass index is 26.82 kg/m.  General:  WDWN in NAD; vital signs documented above Gait: Not observed HENT: WNL, normocephalic Pulmonary: normal non-labored breathing , without Rales, rhonchi,  wheezing Cardiac: regular HR Abdomen: soft, NT, no masses Skin: without rashes Vascular Exam/Pulses:  Right Left  Radial 2+ (normal) 2+ (normal)  DP 2+ (normal) 2+ (normal)   Extremities: large varicosity in medial lower R leg; large varicosities along the path of L SSV; no venous ulcers or pigmentation changes Musculoskeletal: no muscle wasting or atrophy  Neurologic: A&O X 3;  No focal weakness or paresthesias are detected Psychiatric:  The pt has Normal affect.  Non-invasive Vascular Imaging   BLE Venous Insufficiency Duplex:   RLE:   Negative for DVT and SVT,   GSV reflux throughout ,  GSV diameter 24mm  SSV reflux,  deep venous reflux in CFV, FV, and popliteal vein   LLE:  Negative for DVT and SVT,   GSV reflux throughout except in mid thigh,   GSV diameter 3.5-11mm  SSV reflux,  Negative for deep venous reflux   Medical Decision Making   Dennis Hughes is a 49 y.o. male who presents with: BLE chronic venous insufficiency with symptomatic varicose veins   Venous reflux study was negative for DVT of BLE  Relfux was noted throughout superficial and deep system of RLE and throughout superficial venous system of LLE  Encouraged patient to continue leg elevation when possible during the day  Patient was measured for and prescribed thigh high 20-80mmHg  compression to be worn daily  Also encouraged patient to use NSAIDs for discomfort associated with varicosities  Patient will follow up with Dr. or Dr. in 3 months to further evaluate venous insufficiency   , PA-C [facility] Office: [phone]  04/19/2020, 2:38 PM  Clinic MD: Early

## 2020-07-20 ENCOUNTER — Ambulatory Visit: Payer: BC Managed Care – PPO | Admitting: Vascular Surgery

## 2020-07-20 ENCOUNTER — Encounter: Payer: Self-pay | Admitting: Vascular Surgery

## 2020-07-20 ENCOUNTER — Other Ambulatory Visit: Payer: Self-pay

## 2020-07-20 VITALS — BP 119/72 | HR 65 | Temp 97.9°F | Resp 16 | Ht 79.0 in | Wt 232.0 lb

## 2020-07-20 DIAGNOSIS — I83813 Varicose veins of bilateral lower extremities with pain: Secondary | ICD-10-CM | POA: Diagnosis not present

## 2020-07-20 NOTE — Progress Notes (Signed)
Patient is a 49 year old male who returns for follow-up today.  He was seen in our [facility] on April 19, 2020.  At that time he was having heaviness fullness and aching of both lower extremities with some swelling from the knee down into the foot.  He had also noticed his varicose veins have become more prominent.  He does have a history of a right lower extremity DVT and pulmonary embolus in 2010 as well as 2015.  His primary area that is bothersome to him is over a very dilated vein just above his right ankle on the medial aspect of his leg.  He states this area will itch frequently and also has a sensation of wearing a ankle weight around his leg.  It is very bothersome to him.  He also has varicose veins in the left leg which occasionally itch and burn especially over a cluster in the left calf.  But these are not as bothersome as his right leg.  He has been on anticoagulation in the past but not for several years.  His DVT was thought to be secondary to sedentary behavior.  He has changed his lifestyle lost weight and been more active since then.  Review of systems: He has no shortness of breath.  He has no chest pain.  Physical exam:  Vitals:   07/20/20 1347  BP: 119/72  Pulse: 65  Resp: 16  Temp: 97.9 F (36.6 C)  TempSrc: Temporal  SpO2: 99%  Weight: 232 lb (105.2 kg)  Height: 6\' 7"  (2.007 m)    Extremities: 2+ dorsalis pedis pulses bilaterally, large varicosity right medial leg 4 cm above the ankle approximately 10 mm in diameter  Data: Patient had a previous venous duplex exam in August of this year which showed right side diffuse superficial and deep vein reflux.  He also had reflux in the left greater saphenous vein.  He also had reflux in the lesser saphenous vein bilaterally.  Lesser saphenous was less than 4 mm bilaterally.  Right greater saphenous vein was 4 to 4-1/2 mm in diameter.  Left greater saphenous vein was about 4 mm in diameter.  I repeated portions of his exam  today with the SonoSite which shows the greater saphenous vein bilaterally is about 4-1/2 to 5 mm in diameter.      Assessment: Symptomatic varicose veins with pain.  Patient's primary complaint is of the varicosity above his right ankle.  We discussed today the possibility of laser ablation of his right greater saphenous vein or just stab avulsions of the varicosity cluster over his right ankle.  Additionally we talked about laser ablation and stab avulsions in his left leg.  However, the biggest complaint is over the varicosity just above his right ankle.  What his preference would be would just do a stab avulsion of this.  We will discuss with his insurance company the possibility of stab avulsions versus laser ablation plus stab avulsion.  Most likely he would defer any treatment for his left leg currently.  In the meanwhile he will continue to wear his compression stockings.  Plan: Laser ablation right greater saphenous vein plus minus stab avulsions of the large varicosity above his right ankle.  Patient is also considering isolated stab avulsions above the right ankle.  We will get some pricing information for him with and without insurance coverage and get back to him.  He will continue to wear his compression stockings.  September, MD [facility] Office: [phone]

## 2020-08-10 ENCOUNTER — Encounter: Payer: BC Managed Care – PPO | Admitting: Vascular Surgery

## 2020-08-19 ENCOUNTER — Encounter: Payer: Self-pay | Admitting: Vascular Surgery

## 2020-08-19 ENCOUNTER — Other Ambulatory Visit: Payer: Self-pay

## 2020-08-19 ENCOUNTER — Ambulatory Visit: Payer: BC Managed Care – PPO | Admitting: Vascular Surgery

## 2020-08-19 VITALS — BP 114/73 | HR 55 | Temp 97.7°F | Resp 18 | Ht >= 80 in | Wt 228.0 lb

## 2020-08-19 DIAGNOSIS — I83811 Varicose veins of right lower extremities with pain: Secondary | ICD-10-CM | POA: Diagnosis not present

## 2020-08-19 HISTORY — PX: OTHER SURGICAL HISTORY: SHX169

## 2020-08-19 NOTE — Progress Notes (Signed)
    Stab Phlebectomy Procedure  Dennis Hughes DOB:08-07-1971  08/19/2020  Consent signed: Yes  Surgeon:C.E. Kemi Gell, MD  Procedure: stab phlebectomy: right leg  BP 114/73 (BP Location: Left Arm, Patient Position: Sitting, Cuff Size: Normal)   Pulse (!) 55   Temp 97.7 F (36.5 C) (Temporal)   Resp 18   Ht 6\' 8"  (2.032 m)   Wt 228 lb (103.4 kg)   SpO2 98%   BMI 25.05 kg/m   Start time: 10:55 AM    End time: 11:30 AM    Tumescent Anesthesia: 100 cc 0.9% NaCl with 50 cc Lidocaine HCL with 1% Epi and 15 cc 8.4% NaHCO3  Local Anesthesia: 3 cc Lidocaine HCL and NaHCO3 (ratio 2:1)    Stab Phlebectomy: < 10 incisions  Sites: Calf  Patient tolerated procedure well: Yes  Notes: Dennis Hughes wore facial mask.  All staff members wore facial mask and goggles.    Description of Procedure:  After marking the course of the secondary varicosities, the patient was placed on the operating table in the supine position, and the right leg was prepped and draped in sterile fashion.    The patient was then put into Trendelenburg position.  Local anesthetic was administered at the previously marked varicosities, and tumescent anesthesia was administered around the vessels.  Less than 10 incisions stab wounds were made using the tip of an 11 blade. And using the vein hook, the vein was elevated up into the operative field and there was a saccular type venous structure with 3 large feeder branches.  Each of these was about 4 mm in diameter.  The venous sac was about 12 mm in diameter when fully expanded.  Due to this I ligated each medial branch with a 0 silk tie and transected the branch.  This entire complex was then removed..  Adequate hemostasis was achieved, and steri strips were applied to the stab wound.      ABD pads and thigh high compression stockings were applied as well ace wraps where needed. Blood loss was less than 15 cc.  The patient ambulated out of the operating room having  tolerated the procedure well.  [name], MD [facility] Office: [phone]

## 2020-08-31 ENCOUNTER — Other Ambulatory Visit: Payer: Self-pay

## 2020-08-31 ENCOUNTER — Ambulatory Visit: Payer: Self-pay | Admitting: Vascular Surgery

## 2020-08-31 ENCOUNTER — Encounter: Payer: Self-pay | Admitting: Vascular Surgery

## 2020-08-31 VITALS — BP 123/76 | HR 52 | Temp 97.3°F | Resp 16 | Ht >= 80 in | Wt 229.0 lb

## 2020-08-31 DIAGNOSIS — I868 Varicose veins of other specified sites: Secondary | ICD-10-CM

## 2020-08-31 NOTE — Progress Notes (Signed)
Patient is a 49 year old male who returns for postoperative follow-up today.  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  He has had no incisional drainage.  He feels to have some mild tenderness there is no numbness or tingling.  He is wishing to return to normal activities.  He continues to wear his compression stocking.  Physical exam:  Vitals:   08/31/20 1410  BP: 123/76  Pulse: (!) 52  Resp: 16  Temp: (!) 97.3 F (36.3 C)  TempSrc: Temporal  SpO2: 99%  Weight: 229 lb (103.9 kg)  Height: 6\' 8"  (2.032 m)   Healing incision about 4 cm above the right medial malleolus.  No drainage no erythema mild tenderness some surrounding ecchymosis  Assessment: Doing well status post resection of venous aneurysm right medial leg  Plan: Patient will continue to wear his compression stockings as frequently as practical.  He can return to his running hobby in a few more weeks.  He can start lifting some now if he wishes.  He is also going to start using an exercise bike some as well.  I told him to wait a few more days before getting into the pool.  Otherwise patient will follow up on an as-needed basis.  , MD [facility] Office: [phone]

## 2020-09-07 ENCOUNTER — Other Ambulatory Visit: Payer: BC Managed Care – PPO

## 2020-09-07 DIAGNOSIS — Z20822 Contact with and (suspected) exposure to covid-19: Secondary | ICD-10-CM

## 2020-09-08 LAB — NOVEL CORONAVIRUS, NAA: SARS-CoV-2, NAA: NOT DETECTED

## 2020-09-08 LAB — SARS-COV-2, NAA 2 DAY TAT

## 2020-11-29 ENCOUNTER — Encounter: Payer: Self-pay | Admitting: Vascular Surgery

## 2021-02-06 DIAGNOSIS — R509 Fever, unspecified: Secondary | ICD-10-CM | POA: Diagnosis not present

## 2021-02-06 DIAGNOSIS — R519 Headache, unspecified: Secondary | ICD-10-CM | POA: Diagnosis not present

## 2021-02-06 DIAGNOSIS — U071 COVID-19: Secondary | ICD-10-CM | POA: Diagnosis not present

## 2021-04-03 DIAGNOSIS — D649 Anemia, unspecified: Secondary | ICD-10-CM | POA: Diagnosis not present

## 2021-04-03 DIAGNOSIS — E7800 Pure hypercholesterolemia, unspecified: Secondary | ICD-10-CM | POA: Diagnosis not present

## 2021-04-03 DIAGNOSIS — Z0000 Encounter for general adult medical examination without abnormal findings: Secondary | ICD-10-CM | POA: Diagnosis not present

## 2022-04-17 DIAGNOSIS — E7800 Pure hypercholesterolemia, unspecified: Secondary | ICD-10-CM | POA: Diagnosis not present

## 2022-04-17 DIAGNOSIS — Z0000 Encounter for general adult medical examination without abnormal findings: Secondary | ICD-10-CM | POA: Diagnosis not present

## 2022-04-17 DIAGNOSIS — Z125 Encounter for screening for malignant neoplasm of prostate: Secondary | ICD-10-CM | POA: Diagnosis not present

## 2023-03-14 DIAGNOSIS — D122 Benign neoplasm of ascending colon: Secondary | ICD-10-CM | POA: Diagnosis not present

## 2023-03-14 DIAGNOSIS — Z1211 Encounter for screening for malignant neoplasm of colon: Secondary | ICD-10-CM | POA: Diagnosis not present

## 2023-04-23 DIAGNOSIS — E7800 Pure hypercholesterolemia, unspecified: Secondary | ICD-10-CM | POA: Diagnosis not present

## 2023-04-23 DIAGNOSIS — Z125 Encounter for screening for malignant neoplasm of prostate: Secondary | ICD-10-CM | POA: Diagnosis not present

## 2023-04-23 DIAGNOSIS — Z0000 Encounter for general adult medical examination without abnormal findings: Secondary | ICD-10-CM | POA: Diagnosis not present

## 2023-04-23 DIAGNOSIS — R739 Hyperglycemia, unspecified: Secondary | ICD-10-CM | POA: Diagnosis not present

## 2023-04-23 DIAGNOSIS — Z23 Encounter for immunization: Secondary | ICD-10-CM | POA: Diagnosis not present

## 2023-06-30 DIAGNOSIS — U071 COVID-19: Secondary | ICD-10-CM | POA: Diagnosis not present

## 2024-06-04 ENCOUNTER — Other Ambulatory Visit (HOSPITAL_BASED_OUTPATIENT_CLINIC_OR_DEPARTMENT_OTHER): Payer: Self-pay | Admitting: Internal Medicine

## 2024-06-04 DIAGNOSIS — Z23 Encounter for immunization: Secondary | ICD-10-CM | POA: Diagnosis not present

## 2024-06-04 DIAGNOSIS — Z86711 Personal history of pulmonary embolism: Secondary | ICD-10-CM | POA: Diagnosis not present

## 2024-06-04 DIAGNOSIS — E7800 Pure hypercholesterolemia, unspecified: Secondary | ICD-10-CM | POA: Diagnosis not present

## 2024-06-04 DIAGNOSIS — R7303 Prediabetes: Secondary | ICD-10-CM | POA: Diagnosis not present

## 2024-06-04 DIAGNOSIS — Q631 Lobulated, fused and horseshoe kidney: Secondary | ICD-10-CM | POA: Diagnosis not present

## 2024-06-04 DIAGNOSIS — Z0000 Encounter for general adult medical examination without abnormal findings: Secondary | ICD-10-CM | POA: Diagnosis not present

## 2024-06-26 ENCOUNTER — Ambulatory Visit (HOSPITAL_BASED_OUTPATIENT_CLINIC_OR_DEPARTMENT_OTHER)
Admission: RE | Admit: 2024-06-26 | Discharge: 2024-06-26 | Disposition: A | Discharge status: Home / Self Care | Payer: Self-pay | Source: Ambulatory Visit | Attending: Internal Medicine | Admitting: Internal Medicine

## 2024-06-26 DIAGNOSIS — E7800 Pure hypercholesterolemia, unspecified: Secondary | ICD-10-CM | POA: Insufficient documentation
